# Patient Record
Sex: Male | Born: 1995 | Race: White | Hispanic: No | Marital: Single | Smoking: Never smoker
Health system: Southern US, Community
[De-identification: ages and names within clinical notes are randomized; demographics above are authoritative.]

---

## 2015-10-03 ENCOUNTER — Ambulatory Visit (INDEPENDENT_AMBULATORY_CARE_PROVIDER_SITE_OTHER): Payer: BLUE CROSS/BLUE SHIELD | Admitting: Podiatry

## 2015-10-03 ENCOUNTER — Ambulatory Visit (INDEPENDENT_AMBULATORY_CARE_PROVIDER_SITE_OTHER): Payer: BLUE CROSS/BLUE SHIELD

## 2015-10-03 ENCOUNTER — Encounter: Payer: Self-pay | Admitting: Podiatry

## 2015-10-03 VITALS — BP 136/93 | HR 74 | Resp 16

## 2015-10-03 DIAGNOSIS — M79673 Pain in unspecified foot: Secondary | ICD-10-CM

## 2015-10-03 DIAGNOSIS — Q6689 Other  specified congenital deformities of feet: Secondary | ICD-10-CM

## 2015-10-03 DIAGNOSIS — M779 Enthesopathy, unspecified: Secondary | ICD-10-CM

## 2015-10-03 MED ORDER — TRIAMCINOLONE ACETONIDE 10 MG/ML IJ SUSP
10.0000 mg | Freq: Once | INTRAMUSCULAR | Status: AC
  Administered 2015-10-03: 10 mg

## 2015-10-03 NOTE — Progress Notes (Signed)
   Subjective:    Patient ID: Seth Watson, male    DOB: August 20, 1995, 19 y.o.   MRN: [id number]  HPI Patient presents with foot pain in their left foot; dorsal - near ankle joint. Pt stated, "Went to see Dr. Barrington EllisonStrong in [place] two weeks ago and was told had a missing bone, then had cartilidge issues; MRI was done"  Pt wants a second opinion; Pt has had foot pain in left foot since age 20.   Review of Systems  All other systems reviewed and are negative.      Objective:   Physical Exam        Assessment & Plan:

## 2015-10-03 NOTE — Progress Notes (Signed)
Subjective:     Patient ID: Seth Watson, male   DOB: 30-Jul-1995, 20 y.o.   MRN: [id number]  HPI patient presents stating I'm having a lot of pain in my ankle joint left and it's been present for about 5 years and I had an injection last year which improved her for a period of time and 1 early this year which was not successful. They talk about a bone that I might be missing and talk about fusion of bones   Review of Systems  All other systems reviewed and are negative.      Objective:   Physical Exam  Constitutional: He is oriented to person, place, and time.  Cardiovascular: Intact distal pulses.   Musculoskeletal: Normal range of motion.  Neurological: He is oriented to person, place, and time.  Skin: Skin is warm.  Nursing note and vitals reviewed.  neurovascular status found to be intact with muscle strength adequate and patient having normal range of motion right subtalar joint and ankle joint and restricted motion of the subtalar joint left with mild inflammation around the subtalar joint and into the sinus tarsi left. 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 And right. Today I went ahead and I injected the left sinus tarsi 3 mg Kenalog 5 mg Xylocaine and then injected the lateral ankle gutter 3 mg Kenalog 5 mg Xylocaine and advised on anti-inflammatory therapy. I will review this case with Dr. Ardelle AntonWagoner and we will consider CT scans with the possibility long-term for subtalar joint fusion some form of bracing system or possibly resection of coalition. Reviewed x-ray length with his family the causes of pain and probable  Indicated significant changes occurring within the subtalar joint left with no  indication of calcaneal navicular bar

## 2015-10-09 ENCOUNTER — Telehealth: Payer: Self-pay | Admitting: *Deleted

## 2015-10-09 DIAGNOSIS — Q6689 Other  specified congenital deformities of feet: Secondary | ICD-10-CM

## 2015-10-09 DIAGNOSIS — M79673 Pain in unspecified foot: Secondary | ICD-10-CM

## 2015-10-09 DIAGNOSIS — M779 Enthesopathy, unspecified: Secondary | ICD-10-CM

## 2015-10-09 NOTE — Telephone Encounter (Addendum)
 Imaging states pt request to have CT closer to [place].  10/10/2015-Orders faxed to [facility] [phone]. 10/24/2015-Pt's ftr, Dan request pt's CT results.  10/25/2015-Dr. Regal reviewed 10/17/2015 CT results and requested copy of CT Scan disc to be sent for an overread.  Informed Dan of Dr. Beverlee Nimsegal's request to send disc for more indepth reading, and we would call with results and instructions.  Faxed [facility] for CT Scan disc copy [facility].  11/08/2015-Pt's ftr, Jesusita Okaan called.  I called [facility] for report of overread of CT disc.  [facility] receptionist states they do not have this pt's CT in their system.  I spoke with Dr. Charlsie Merlesegal and he stated he would like to have the CT overread, because pt's symptoms are not comparable with the CT and X-ray findings.  I explained to Jesusita OkaDan that the CT disc was sent to [facility] on 10/29/2015, but [facility] could not find and that Dr. Charlsie Merlesegal DID want the overread and I would order stat and send stat.

## 2015-11-08 ENCOUNTER — Telehealth: Payer: Self-pay | Admitting: *Deleted

## 2015-11-08 NOTE — Telephone Encounter (Signed)
Entered in error

## 2015-11-13 ENCOUNTER — Telehealth: Payer: Self-pay | Admitting: *Deleted

## 2015-11-13 NOTE — Telephone Encounter (Addendum)
[facility] - Magda Paganiniwanda states the copy of the CT disc was mailed 11/11/2015.  11/14/2015-Mailed 2nd CT disc copy to [facility]. 11/19/2015-[facility] results received. Schedulers to contact pt to schedule appt to discuss results.  Pt called states he is continuing to have pain.  Pt is scheduled to see Dr. Charlsie Merlesegal on 11/21/2015.  11/19/2015-Dr. Regal came in today and reviewed pt's CT Over read and states he would prefer pt come in when Dr. Ardelle AntonWagoner was here on 11/22/2015.  I left message requesting pt call and change his appt to 11/22/2015, and that Dr. Charlsie Merlesegal had ordered a light pain medication, Tramadol 50mg  #30 one tablet every 8 hours prn foot pain. Called to pt's pharmacy.  I spoke with Martie LeeSabrina - [facility] and requested the CT disc be sent to the [facility] office address as soon as possible.  Martie LeeSabrina states she'll put in the mail tomorrow.  12/04/2015-post op courtesy call-Left message instructing pt to rest, ice and elevate, not to weight bear or dangle the foot more than 15 mins/hour, remain in the boot at all times, leave the dressing in place clean and dry, take the pain medication as directed and call with concerns.

## 2015-11-19 MED ORDER — TRAMADOL HCL 50 MG PO TABS: 50.0000 mg | ORAL_TABLET | Freq: Three times a day (TID) | ORAL | Status: AC | PRN

## 2015-11-21 ENCOUNTER — Ambulatory Visit: Payer: BLUE CROSS/BLUE SHIELD | Admitting: Podiatry

## 2015-11-22 ENCOUNTER — Ambulatory Visit (INDEPENDENT_AMBULATORY_CARE_PROVIDER_SITE_OTHER): Payer: BLUE CROSS/BLUE SHIELD | Admitting: Podiatry

## 2015-11-22 ENCOUNTER — Encounter: Payer: Self-pay | Admitting: Podiatry

## 2015-11-22 VITALS — BP 114/77 | HR 59 | Resp 16

## 2015-11-22 DIAGNOSIS — M79673 Pain in unspecified foot: Secondary | ICD-10-CM

## 2015-11-22 DIAGNOSIS — Q6689 Other  specified congenital deformities of feet: Secondary | ICD-10-CM

## 2015-11-22 NOTE — Patient Instructions (Signed)
Pre-Operative Instructions  Congratulations, you have decided to take an important step to improving your quality of life.  You can be assured that the doctors of [facility] will be with you every step of the way.  1. Plan to be at the surgery center/hospital at least 1 (one) hour prior to your scheduled time unless otherwise directed by the surgical center/hospital staff.  You must have a responsible adult accompany you, remain during the surgery and drive you home.  Make sure you have directions to the surgical center/hospital and know how to get there on time. 2. For hospital based surgery you will need to obtain a history and physical form from your family physician within 1 month prior to the date of surgery- we will give you a form for you primary physician.  3. We make every effort to accommodate the date you request for surgery.  There are however, times where surgery dates or times have to be moved.  We will contact you as soon as possible if a change in schedule is required.   4. No Aspirin/Ibuprofen for one week before surgery.  If you are on aspirin, any non-steroidal anti-inflammatory medications (Mobic, Aleve, Ibuprofen) you should stop taking it 7 days prior to your surgery.  You make take Tylenol  For pain prior to surgery.  5. Medications- If you are taking daily heart and blood pressure medications, seizure, reflux, allergy, asthma, anxiety, pain or diabetes medications, make sure the surgery center/hospital is aware before the day of surgery so they may notify you which medications to take or avoid the day of surgery. 6. No food or drink after midnight the night before surgery unless directed otherwise by surgical center/hospital staff. 7. No alcoholic beverages 24 hours prior to surgery.  No smoking 24 hours prior to or 24 hours after surgery. 8. Wear loose pants or shorts- loose enough to fit over bandages, boots, and casts. 9. No slip on shoes, sneakers are best. 10. Bring  your boot with you to the surgery center/hospital.  Also bring crutches or a walker if your physician has prescribed it for you.  If you do not have this equipment, it will be provided for you after surgery. 11. If you have not been contracted by the surgery center/hospital by the day before your surgery, call to confirm the date and time of your surgery. 12. Leave-time from work may vary depending on the type of surgery you have.  Appropriate arrangements should be made prior to surgery with your employer. 13. Prescriptions will be provided immediately following surgery by your doctor.  Have these filled as soon as possible after surgery and take the medication as directed. 14. Remove nail polish on the operative foot. 15. Wash the night before surgery.  The night before surgery wash the foot and leg well with the antibacterial soap provided and water paying special attention to beneath the toenails and in between the toes.  Rinse thoroughly with water and dry well with a towel.  Perform this wash unless told not to do so by your physician.  Enclosed: 1 Ice pack (please put in freezer the night before surgery)   1 Hibiclens skin cleaner   Pre-op Instructions  If you have any questions regarding the instructions, do not hesitate to call our office.  [place]: [address] [place], [place] [address] [phone]  [place]: [address] [place], [place] [address] [phone]  [place]: [address]  [place], [place] [address] [phone]   Dr.   Bevan Vu DPM, Dr. Matthew Wagoner DPM, Dr. M. Todd Hyatt DPM, Dr. Titorya Stover DPM 

## 2015-11-24 NOTE — Progress Notes (Signed)
Subjective:     Patient ID: Seth Watson, male   DOB: 1995/12/30, 19 y.o.   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 Ardelle AntonWagoner area we reviewed CT scans and x-rays and decide since he has no motion now that this would be the best long-term procedure for him. Allowed him to read consent form reviewing at great length the procedure itself and the fact that recovery will be approximate one year with no long-term guarantees and that he will loose further motion. Patient wants surgery signed consent form is dispensed air fracture walker and is scheduled for outpatient surgery which will be on Dr. Ardelle AntonWagoner schedule and I will assist. Dr. Ardelle AntonWagoner did review the course of treatment with him

## 2015-11-26 ENCOUNTER — Telehealth: Payer: Self-pay | Admitting: *Deleted

## 2015-11-26 NOTE — Telephone Encounter (Signed)
Patient's father, Sherald HessDan Webb, called this morning asking if his son would need any type of lab works or EKG done prior to his surgery which will be on December 03, 2015 at 6 a.m. I asked Delydia, CMA who coordinates the surgeries if the patient would need to get any type of lab work done. She stated, "He does not need any done. [facility] does not require any type of lab work before doing procedures". I informed Mr. Hyman HopesWebb and he stated he understood.

## 2015-11-27 ENCOUNTER — Telehealth: Payer: Self-pay | Admitting: *Deleted

## 2015-11-27 NOTE — Telephone Encounter (Signed)
I'm calling to let you know that Dr. Charlsie Merlesegal was able to rearrange his schedule.  He will be able to do your surgery on Tuesday the 6th after all.  "Great, thank you so much.  Will I need to be there at the same time?"  Yes, everything will be the same.  I called and rescheduled surgery at the surgical center.

## 2015-11-27 NOTE — Telephone Encounter (Signed)
I am calling regarding your surgery.  Dr. Charlsie Merlesegal has a scheduling conflict and we need to reschedule your surgery to Wednesday, June 7 instead of June 6.  Will this be okay with you?  "No, it shouldn't be a problem."  Thank you so much.  Surgery was moved from 12/03/2015 to 12/04/2015 at the surgical center.

## 2015-12-03 ENCOUNTER — Encounter: Payer: Self-pay | Admitting: Podiatry

## 2015-12-03 DIAGNOSIS — S93612S Sprain of tarsal ligament of left foot, sequela: Secondary | ICD-10-CM | POA: Diagnosis not present

## 2015-12-11 ENCOUNTER — Ambulatory Visit (INDEPENDENT_AMBULATORY_CARE_PROVIDER_SITE_OTHER): Payer: BLUE CROSS/BLUE SHIELD | Admitting: Podiatry

## 2015-12-11 ENCOUNTER — Ambulatory Visit (HOSPITAL_BASED_OUTPATIENT_CLINIC_OR_DEPARTMENT_OTHER)
Admission: RE | Admit: 2015-12-11 | Discharge: 2015-12-11 | Disposition: A | Discharge status: Home / Self Care | Payer: BLUE CROSS/BLUE SHIELD | Source: Ambulatory Visit | Attending: Podiatry | Admitting: Podiatry

## 2015-12-11 ENCOUNTER — Encounter: Payer: Self-pay | Admitting: Podiatry

## 2015-12-11 DIAGNOSIS — Z9889 Other specified postprocedural states: Secondary | ICD-10-CM | POA: Insufficient documentation

## 2015-12-11 DIAGNOSIS — Q6689 Other  specified congenital deformities of feet: Secondary | ICD-10-CM | POA: Diagnosis not present

## 2015-12-11 DIAGNOSIS — M79673 Pain in unspecified foot: Secondary | ICD-10-CM

## 2015-12-12 NOTE — Progress Notes (Addendum)
Patient ID: Seth Watson Watson, male   DOB: Jan 22, 1996, 20 y.o.   MRN: [id number]  Subjective: Seth Watson Kirksey is a 20 y.o. is seen today in office s/p left STJ fusion preformed on 12/03/15. He states he is doing well. He has decreased the amount of pain medicines been taking it maybe takes it once a day. He has remained nonweightbearing. Denies any systemic complaints such as fevers, chills, nausea, vomiting. No calf pain, chest pain, shortness of breath.   Objective: General: No acute distress, AAOx3; posterior splint intact DP/PT pulses palpable 2/4, CRT < 3 sec to all digits.  Protective sensation intact. Motor function intact.  Left foot: Incision is well coapted without any evidence of dehiscence and sutures intact. There is no surrounding erythema, ascending cellulitis, fluctuance, crepitus, malodor, drainage/purulence. There is mild edema around the surgical site. There is mild pain along the surgical site. There appears to be a superficial abrasion, erythema on the dorsomedial aspect the left foot likely from the bandage. No flexions or crepitus. No drainage. No open lesion. No other areas of tenderness to bilateral lower extremities.  No other open lesions or pre-ulcerative lesions.  No pain with calf compression, swelling, warmth, erythema.   Assessment and Plan:  Status post left foot STJ fusion, doing well with no complications   -Treatment options discussed including all alternatives, risks, and complications -X-rays ordered and reviewed. -And buttock ointment was applied followed by Adaptic overlying the abrasion as well as the incision. Dressing was applied. Well-padded below-knee fiberglass posterior splint was applied making sure to pad all bony prominences. -Continue nonweightbearing with a knee scooter for which she has. -Ice/elevation -Pain medication as needed. -Monitor for any clinical signs or symptoms of infection and DVT/PE and directed to call the office immediately should any  occur or go to the ER. -Follow-up in 1 week or sooner if any problems arise. In the meantime, encouraged to call the office with any questions, concerns, change in symptoms.   Ovid CurdMatthew Davon Abdelaziz, DPM

## 2015-12-13 ENCOUNTER — Encounter: Payer: Self-pay | Admitting: Podiatry

## 2015-12-18 ENCOUNTER — Encounter: Payer: Self-pay | Admitting: Podiatry

## 2015-12-20 ENCOUNTER — Encounter: Payer: Self-pay | Admitting: Podiatry

## 2015-12-20 ENCOUNTER — Ambulatory Visit (INDEPENDENT_AMBULATORY_CARE_PROVIDER_SITE_OTHER): Payer: BLUE CROSS/BLUE SHIELD | Admitting: Podiatry

## 2015-12-20 DIAGNOSIS — Q6689 Other  specified congenital deformities of feet: Secondary | ICD-10-CM

## 2015-12-20 DIAGNOSIS — Z9889 Other specified postprocedural states: Secondary | ICD-10-CM

## 2015-12-20 MED ORDER — OXYCODONE-ACETAMINOPHEN 5-325 MG PO TABS: 1.0000 | ORAL_TABLET | Freq: Four times a day (QID) | ORAL | Status: DC | PRN

## 2015-12-20 MED ORDER — CYCLOBENZAPRINE HCL 10 MG PO TABS: 10.0000 mg | ORAL_TABLET | Freq: Three times a day (TID) | ORAL | Status: AC | PRN

## 2015-12-22 NOTE — Progress Notes (Signed)
Patient ID: Seth AlarBryant Austria, male   DOB: 1996/03/04, 20 y.o.   MRN: [id number]  Subjective: Seth Watson is a 20 y.o. is seen today in office s/p left STJ fusion preformed on 11/02/15. He states he is doing well and his pain has decreased compared to last week's visit. He is remaining nonweightbearing and he denies any recent injury or falls. Denies any systemic complaints such as fevers, chills, nausea, vomiting. No calf pain, chest pain, shortness of breath.   Objective: General: No acute distress, AAOx3; posterior splint intact DP/PT pulses palpable 2/4, CRT < 3 sec to all digits.  Protective sensation intact. Motor function intact.  Left foot: Incision is well coapted without any evidence of dehiscence and staples and sutures intact. There is no surrounding erythema, ascending cellulitis, fluctuance, crepitus, malodor, drainage/purulence. There is mild edema around the surgical site but improved. There is decreased pain along the surgical site. The abrasion lesion on the dorsal aspect of the foot has significantly improved. No fluctuation or crepitus. No drainage. No open lesion. No other areas of tenderness to bilateral lower extremities.  No other open lesions or pre-ulcerative lesions.  No pain with calf compression, swelling, warmth, erythema.   Assessment and Plan:  Status post left foot STJ fusion, doing well with no complications   -Treatment options discussed including all alternatives, risks, and complications -Previous x-rays were reviewed with the patient. -Staples and sutures removed today in total. Antibiotic ointment was applied followed by dressing. Next a well-padded below-knee fiberglass cast was applied making sure to pad all bony prominences. -Continue pain medication as needed. Pain medication was refilled today. -Nonweightbearing -Ice and elevation -Aspirin daily -Follow-up in 3 weeks or sooner if needed. Encouraged to call any questions or concerns in the meantime.  Ovid CurdMatthew  Wagoner, DPM

## 2015-12-27 ENCOUNTER — Ambulatory Visit: Payer: BLUE CROSS/BLUE SHIELD | Admitting: Podiatry

## 2015-12-30 ENCOUNTER — Encounter: Payer: Self-pay | Admitting: Podiatry

## 2016-01-08 ENCOUNTER — Ambulatory Visit (INDEPENDENT_AMBULATORY_CARE_PROVIDER_SITE_OTHER): Payer: BLUE CROSS/BLUE SHIELD | Admitting: Podiatry

## 2016-01-08 ENCOUNTER — Ambulatory Visit (HOSPITAL_BASED_OUTPATIENT_CLINIC_OR_DEPARTMENT_OTHER)
Admission: RE | Admit: 2016-01-08 | Discharge: 2016-01-08 | Disposition: A | Discharge status: Home / Self Care | Payer: BLUE CROSS/BLUE SHIELD | Source: Ambulatory Visit | Attending: Podiatry | Admitting: Podiatry

## 2016-01-08 ENCOUNTER — Other Ambulatory Visit: Payer: Self-pay | Admitting: Podiatry

## 2016-01-08 DIAGNOSIS — Z9889 Other specified postprocedural states: Secondary | ICD-10-CM | POA: Insufficient documentation

## 2016-01-08 DIAGNOSIS — Q6689 Other  specified congenital deformities of feet: Secondary | ICD-10-CM | POA: Insufficient documentation

## 2016-01-08 MED ORDER — GABAPENTIN 100 MG PO CAPS: 100.0000 mg | ORAL_CAPSULE | Freq: Every day | ORAL | Status: AC

## 2016-01-08 NOTE — Patient Instructions (Signed)
Gabapentin capsules or tablets What is this medicine? GABAPENTIN (GA ba pen tin) is used to control partial seizures in adults with epilepsy. It is also used to treat certain types of nerve pain. This medicine may be used for other purposes; ask your health care provider or pharmacist if you have questions. What should I tell my health care provider before I take this medicine? They need to know if you have any of these conditions: -kidney disease -suicidal thoughts, plans, or attempt; a previous suicide attempt by you or a family member -an unusual or allergic reaction to gabapentin, other medicines, foods, dyes, or preservatives -pregnant or trying to get pregnant -breast-feeding How should I use this medicine? Take this medicine by mouth with a glass of water. Follow the directions on the prescription label. You can take it with or without food. If it upsets your stomach, take it with food.Take your medicine at regular intervals. Do not take it more often than directed. Do not stop taking except on your doctor's advice. If you are directed to break the 600 or 800 mg tablets in half as part of your dose, the extra half tablet should be used for the next dose. If you have not used the extra half tablet within 28 days, it should be thrown away. A special MedGuide will be given to you by the pharmacist with each prescription and refill. Be sure to read this information carefully each time. Talk to your pediatrician regarding the use of this medicine in children. Special care may be needed. Overdosage: If you think you have taken too much of this medicine contact a poison control center or emergency room at once. NOTE: This medicine is only for you. Do not share this medicine with others. What if I miss a dose? If you miss a dose, take it as soon as you can. If it is almost time for your next dose, take only that dose. Do not take double or extra doses. What may interact with this medicine? Do not  take this medicine with any of the following medications: -other gabapentin products This medicine may also interact with the following medications: -alcohol -antacids -antihistamines for allergy, cough and cold -certain medicines for anxiety or sleep -certain medicines for depression or psychotic disturbances -homatropine; hydrocodone -naproxen -narcotic medicines (opiates) for pain -phenothiazines like chlorpromazine, mesoridazine, prochlorperazine, thioridazine This list may not describe all possible interactions. Give your health care provider a list of all the medicines, herbs, non-prescription drugs, or dietary supplements you use. Also tell them if you smoke, drink alcohol, or use illegal drugs. Some items may interact with your medicine. What should I watch for while using this medicine? Visit your doctor or health care professional for regular checks on your progress. You may want to keep a record at home of how you feel your condition is responding to treatment. You may want to share this information with your doctor or health care professional at each visit. You should contact your doctor or health care professional if your seizures get worse or if you have any new types of seizures. Do not stop taking this medicine or any of your seizure medicines unless instructed by your doctor or health care professional. Stopping your medicine suddenly can increase your seizures or their severity. Wear a medical identification bracelet or chain if you are taking this medicine for seizures, and carry a card that lists all your medications. You may get drowsy, dizzy, or have blurred vision. Do not drive, use   machinery, or do anything that needs mental alertness until you know how this medicine affects you. To reduce dizzy or fainting spells, do not sit or stand up quickly, especially if you are an older patient. Alcohol can increase drowsiness and dizziness. Avoid alcoholic drinks. Your mouth may get  dry. Chewing sugarless gum or sucking hard candy, and drinking plenty of water will help. The use of this medicine may increase the chance of suicidal thoughts or actions. Pay special attention to how you are responding while on this medicine. Any worsening of mood, or thoughts of suicide or dying should be reported to your health care professional right away. Women who become pregnant while using this medicine may enroll in the [place] American Antiepileptic Drug Pregnancy Registry by calling 1-888-233-2334. This registry collects information about the safety of antiepileptic drug use during pregnancy. What side effects may I notice from receiving this medicine? Side effects that you should report to your doctor or health care professional as soon as possible: -allergic reactions like skin rash, itching or hives, swelling of the face, lips, or tongue -worsening of mood, thoughts or actions of suicide or dying Side effects that usually do not require medical attention (report to your doctor or health care professional if they continue or are bothersome): -constipation -difficulty walking or controlling muscle movements -dizziness -nausea -slurred speech -tiredness -tremors -weight gain This list may not describe all possible side effects. Call your doctor for medical advice about side effects. You may report side effects to FDA at 1-800-FDA-1088. Where should I keep my medicine? Keep out of reach of children. This medicine may cause accidental overdose and death if it taken by other adults, children, or pets. Mix any unused medicine with a substance like cat litter or coffee grounds. Then throw the medicine away in a sealed container like a sealed bag or a coffee can with a lid. Do not use the medicine after the expiration date. Store at room temperature between 15 and 30 degrees C (59 and 86 degrees F). NOTE: This sheet is a summary. It may not cover all possible information. If you have  questions about this medicine, talk to your doctor, pharmacist, or health care provider.    2016, Elsevier/Gold Standard. (2013-08-11 15:26:50)  

## 2016-01-10 ENCOUNTER — Telehealth: Payer: Self-pay | Admitting: *Deleted

## 2016-01-10 DIAGNOSIS — Q6689 Other  specified congenital deformities of feet: Secondary | ICD-10-CM

## 2016-01-10 DIAGNOSIS — Z9889 Other specified postprocedural states: Secondary | ICD-10-CM

## 2016-01-10 NOTE — Telephone Encounter (Addendum)
Pt states had foot recasted 01/08/2016, and began to have discomfort on the top of the foot in the cast on Wednesday evening.  Dr. Ardelle AntonWagoner states stay off the foot, rest, ice and elevate, if worsens go to ER, if continues come in Monday for cast change.  I informed pt and he said he would do that, he had been driving a lot and that may have affected the area. 01/23/2016-Pt's dad, Jesusita OkaDan states pt forgot to get x-rays yesterday after the visit, and he was wondering if they were actually necessary, if so could they be taken in [place] on Monday. Dr. Ardelle AntonWagoner stated he will get the x-rays next time not to worry about 01/22/2016 x-rays. Left message informing pt's ftr of Dr. Gabriel RungWagoner's recommendation. 02/20/2016-Stephanie - [facility] [phone] request PT orders, LOV and Op note faxed to [phone]. 04/17/2016-Dr. Ardelle AntonWagoner states PT recommended custom made orthotics, he would like to see if pt has, and have him make an appt when in town. Left message stating Dr. Ardelle AntonWagoner would like pt to make an appt when in town, to please contact me at [phone]/29/2016-Trey - [facility] picked up clinicals, demographics for pt.11/13/2016-Faxed CT orders with note stating PA was not required, and that the CT would need to be scheduled 2 weeks prior 12/23/2016.

## 2016-01-12 NOTE — Progress Notes (Signed)
Patient ID: Seth Watson, male   DOB: [birth year], 20 y.o.   MRN: [id number]  Subjective: Seth AlarBryant Maslow is a 20 y.o. is seen today in office s/p left STJ fusion preformed on 11/02/15. He states he is doing well and his pain has decreased compared to last week's visit. He does state that he is having some "nervous tension" to his feet. The Flexeril did not help. He is having difficulty sleeping due to some of the tingling to his foot. Than that he is having no acute changes. He states his pain is minimal not taking pain medicine. Denies any systemic complaints such as fevers, chills, nausea, vomiting. No calf pain, chest pain, shortness of breath.   Objective: General: No acute distress, AAOx3; posterior splint intact DP/PT pulses palpable 2/4, CRT < 3 sec to all digits.  Protective sensation intact. Motor function intact.  Left foot: Cast was removed in order to inspect the incision and to see how he is doing. Incision is well coapted without any evidence of dehiscence and scars have formed. There is no surrounding erythema, ascending cellulitis, fluctuance, crepitus, malodor, drainage/purulence. There is mild edema around the surgical site without any associated erythema. There is decreased pain along the surgical site. The abrasion lesion on the dorsal aspect of the foot has significantly improved and new skin has formed.. No fluctuation or crepitus. No drainage. No open lesion. No other areas of tenderness to bilateral lower extremities.  No other open lesions or pre-ulcerative lesions.  No pain with calf compression, swelling, warmth, erythema.   Assessment and Plan:  Status post left foot STJ fusion, doing well with no complications   -Treatment options discussed including all alternatives, risks, and complications -X-rays ordered and reviewed with the patient. -Antibiotic ointment was applied over the incisions followed by dry sterile dressing. A well-padded below-knee fiberglass cast was applied  making sure to pad all bony prominences. -Continue nonweightbearing -Prescribed gabapentin to see if this will help with some of the nerve symptoms that he is having. Discontinue Flexeril. I discussed with him side effects this medicine directed to call the office should any occur. -Ice and elevation -Aspirin daily. -Monitor signs or symptoms of infection and/or DVT/PE injected to call the office immediately should any occur. -Follow-up as scheduled or sooner if any issues are to arise.  Ovid CurdMatthew Wagoner, DPM

## 2016-01-22 ENCOUNTER — Encounter: Payer: Self-pay | Admitting: Podiatry

## 2016-01-22 ENCOUNTER — Ambulatory Visit (INDEPENDENT_AMBULATORY_CARE_PROVIDER_SITE_OTHER): Payer: BLUE CROSS/BLUE SHIELD | Admitting: Podiatry

## 2016-01-22 DIAGNOSIS — Z09 Encounter for follow-up examination after completed treatment for conditions other than malignant neoplasm: Secondary | ICD-10-CM

## 2016-01-23 NOTE — Telephone Encounter (Signed)
Its ok. Ill get them next time. Continue NWB

## 2016-01-27 NOTE — Progress Notes (Signed)
Patient ID: Seth Watson, male   DOB: 03/14/1996, 20 y.o.   MRN: [id number]  Subjective: Tharun Puck is a 20 y.o. is seen today in office s/p left STJ fusion preformed on 11/02/15. He states that overall he is doing well the nerve pain that he was having has been improved with the gabapentin. He states that he is not really having any significant pain to his foot. He has been trying to stay nonweightbearing status possible.  Denies any systemic complaints such as fevers, chills, nausea, vomiting. No calf pain, chest pain, shortness of breath.   Objective: General: No acute distress, AAOx3; posterior splint intact DP/PT pulses palpable 2/4, CRT < 3 sec to all digits.  Protective sensation intact. Motor function intact.  Left foot: Cast is clean, dry, intact. Upon removal incision appears be well coapted without any evidence dehiscence. There is no erythema, ascending saline cellulitis fluctuance, crepitus, malodor. Mild edema to the surgical site. There is no significant tenderness on the surgical site at this time. No other areas of tenderness to bilateral lower extremities.  No other open lesions or pre-ulcerative lesions.  No pain with calf compression, swelling, warmth, erythema.   Assessment and Plan:  Status post left foot STJ fusion, doing well with no complications   -Treatment options discussed including all alternatives, risks, and complications -X-rays ordered today. -Cast was removed. He was placed into a cam boot but remain nonweightbearing. Start range of motion, rehabilitation exercises against gravity. Continue ice and elevation. -Pain medication as needed. Gabapentin as needed. -Aspirin daily. -Monitor signs or symptoms of infection and/or DVT/PE injected to call the office immediately should any occur. -Follow-up as scheduled or sooner if any issues are to arise.  [name], DPM

## 2016-02-05 ENCOUNTER — Encounter: Payer: Self-pay | Admitting: Podiatry

## 2016-02-05 ENCOUNTER — Ambulatory Visit (INDEPENDENT_AMBULATORY_CARE_PROVIDER_SITE_OTHER): Payer: BLUE CROSS/BLUE SHIELD | Admitting: Podiatry

## 2016-02-05 ENCOUNTER — Ambulatory Visit (HOSPITAL_BASED_OUTPATIENT_CLINIC_OR_DEPARTMENT_OTHER)
Admission: RE | Admit: 2016-02-05 | Discharge: 2016-02-05 | Disposition: A | Discharge status: Home / Self Care | Payer: BLUE CROSS/BLUE SHIELD | Source: Ambulatory Visit | Attending: Podiatry | Admitting: Podiatry

## 2016-02-05 DIAGNOSIS — Z09 Encounter for follow-up examination after completed treatment for conditions other than malignant neoplasm: Secondary | ICD-10-CM

## 2016-02-05 DIAGNOSIS — Q6689 Other  specified congenital deformities of feet: Secondary | ICD-10-CM

## 2016-02-06 NOTE — Progress Notes (Signed)
Patient ID: Seth AlarBryant Watson, male   DOB: 01/20/1996, 20 y.o.   MRN: [id number]  Subjective: Seth Watson is a 20 y.o. is seen today in office s/p left STJ fusion preformed on 11/02/15. He states he has no pain to his foot. He medication of discomfort when he keeps his foot down for quite some time and it swells. He states he is no longer having any nerve pain either. He states it is eager to put weight on the foot. Denies any systemic complaints such as fevers, chills, nausea, vomiting. No calf pain, chest pain, shortness of breath.   Objective: General: No acute distress, AAOx3; posterior splint intact DP/PT pulses palpable 2/4, CRT < 3 sec to all digits.  Protective sensation intact. Motor function intact.  Left foot: CAM boot intact. Scars are well-healed without any evidence of dehiscence and there is no surrounding erythema or increase in warmth. No drainage. There is trace edema to the surgical site. There is no tenderness on surgical sites at this time. Overall sensation appears intact. Motor function intact of the digits and ankle. No other areas of tenderness to bilateral lower extremities.  No other open lesions or pre-ulcerative lesions.  No pain with calf compression, swelling, warmth, erythema.   Assessment and Plan:  Status post left foot STJ fusion, doing well with no complications   -Treatment options discussed including all alternatives, risks, and complications -X-rays ordered today. -At this time he can start to transition to partial weightbearing as tolerated in the cam boot. Continue work and boot at all times. Start range of motion exercises to help increase strength. He is going to college in 2 weeks I'll see him before he goes. I will order physical therapy closer to where he is going to college. -Pain medication as needed. Gabapentin as needed. -Aspirin daily. -Monitor signs or symptoms of infection and/or DVT/PE injected to call the office immediately should any  occur. -Follow-up as scheduled or sooner if any issues are to arise.  Ovid CurdMatthew Xitlali Kastens, DPM

## 2016-02-17 ENCOUNTER — Ambulatory Visit (INDEPENDENT_AMBULATORY_CARE_PROVIDER_SITE_OTHER): Payer: BLUE CROSS/BLUE SHIELD

## 2016-02-17 ENCOUNTER — Encounter: Payer: Self-pay | Admitting: Podiatry

## 2016-02-17 ENCOUNTER — Ambulatory Visit (INDEPENDENT_AMBULATORY_CARE_PROVIDER_SITE_OTHER): Payer: BLUE CROSS/BLUE SHIELD | Admitting: Podiatry

## 2016-02-17 DIAGNOSIS — M79671 Pain in right foot: Secondary | ICD-10-CM

## 2016-02-17 DIAGNOSIS — Z9889 Other specified postprocedural states: Secondary | ICD-10-CM

## 2016-02-17 DIAGNOSIS — Q6689 Other  specified congenital deformities of feet: Secondary | ICD-10-CM

## 2016-02-17 NOTE — Progress Notes (Signed)
Patient ID: Seth Watson, male   DOB: [name], 20 y.o.   MRN: [id number]  Subjective: Seth Watson is a 20 y.o. is seen today in office s/p left STJ fusion preformed on 11/02/15. His last appointment he states he has done well he is started to put some weight down on the foot utilizing crutches. [place] and walking about 5 miles in the boot using crutches to help offload the area. He is not able to put full weight on without the use of crutches. He is scheduled to move into his dorm this week.  Denies any systemic complaints such as fevers, chills, nausea, vomiting. No calf pain, chest pain, shortness of breath.   Objective: General: No acute distress, AAOx3; posterior splint intact DP/PT pulses palpable 2/4, CRT < 3 sec to all digits.  Protective sensation intact. Motor function intact.  Left foot: CAM boot intact. Scar from the surgery is well healed. There is no tenderness palpation of the surgical site at this time. No pain with ankle joint range of motion. There is trace edema along the area any erythema or increase in warmth. On the anterior aspect of ankles a superficial abrasion from the bandage however this has improved. There is no surrounding erythema or signs of infection. No other areas of tenderness to bilateral lower extremities. No other open lesions or pre-ulcerative lesions.  No pain with calf compression, swelling, warmth, erythema.   Assessment and Plan:  Status post left foot STJ fusion, doing well with no complications   -Treatment options discussed including all alternatives, risks, and complications -X-rays obtained and reviewed today. Hardware, subtalar joint is increased consolidation across the arthrodesis site. No evidence of acute fracture. -At this time to start transition to weight-bear as tolerated in cam boot. Once is able to do this he can start to transition to regular shoe. I will go ahead and start physical therapy. Physical therapy  can work with him on transition to a regular shoe from the CAM boot and improving strength and mobility to his left foot. -Continue ice and elevation. -Once he returns to a regular shoe, discussed supportive shoes -As he is going to college this week I will see him and he comes back for follow-up break. If at any point he has any questions he is encouraged to call the office and get him into see somebody closer to his school.   Seth Watson, DPM

## 2016-02-20 ENCOUNTER — Telehealth: Payer: Self-pay | Admitting: *Deleted

## 2016-02-20 NOTE — Telephone Encounter (Signed)
Entered in error

## 2016-03-05 NOTE — Progress Notes (Signed)
DOS 06.06.2017 Sub taylor joint fusion with screw left

## 2016-04-02 ENCOUNTER — Encounter: Payer: Self-pay | Admitting: Podiatry

## 2016-04-02 ENCOUNTER — Ambulatory Visit: Payer: BLUE CROSS/BLUE SHIELD

## 2016-04-02 ENCOUNTER — Ambulatory Visit (INDEPENDENT_AMBULATORY_CARE_PROVIDER_SITE_OTHER): Payer: BLUE CROSS/BLUE SHIELD | Admitting: Podiatry

## 2016-04-02 DIAGNOSIS — Z09 Encounter for follow-up examination after completed treatment for conditions other than malignant neoplasm: Secondary | ICD-10-CM

## 2016-04-02 DIAGNOSIS — Q6689 Other  specified congenital deformities of feet: Secondary | ICD-10-CM

## 2016-04-04 NOTE — Progress Notes (Signed)
Patient ID: Seth AlarBryant Basaldua, male   DOB: 03/31/1996, 20 y.o.   MRN: [id number]  Subjective: Seth Watson is a 20 y.o. is seen today in office s/p left STJ fusion preformed on 11/02/15. He states he is doing well and he is returned to regular shoe. He has been in college since the start of the semester he's been able to wear regular shoe and walker across [place] into his daily activities without any pain to his foot and he states that feels substantially better than it did prior to surgery. He presents today wearing a regular shoe walking without any problems Denies any systemic complaints such as fevers, chills, nausea, vomiting. No calf pain, chest pain, shortness of breath.   Objective: General: No acute distress, AAOx3; posterior splint intact DP/PT pulses palpable 2/4, CRT < 3 sec to all digits.  Protective sensation intact. Motor function intact.  Left foot: Scar from the surgery is well healed. There is no tenderness palpation of the surgical site at this time. There is no pain to the ankle or to the lateral gutter. There is no restriction or crepitation of the ankle joint range of motion or any pain with ankle joint range of motion. There is no tenderness palpation of the surgical site. There is no significant edema, erythema or increase in warmth. Overall he appears to be healing well. No other open lesions or pre-ulcerative lesions.  No pain with calf compression, swelling, warmth, erythema.   Assessment and Plan:  Status post left foot STJ fusion, doing well with no complications   -Treatment options discussed including all alternatives, risks, and complications -X-rays obtained and reviewed today. Hardware, subtalar joint is increased consolidation across the arthrodesis site. On the AP view of the ankle does appear the screw is right on the verge of entering the lateral gutter of the ankle however clinically he has no symptoms at this. I discussed these x-ray findings with the patient. -At this  time continue supportive shoe and continue increase his activity. He states in the first gets out of bed he gets some stiffness for the first 2-3 steps but this does resolve very quickly he has no further discomfort or stiffness. -I would like to see him back in 2 months as discussed the possible removal of the hardware causing any issues in the meantime. Call any questions or concerns.  Ovid CurdMatthew Eimi Viney, DPM

## 2016-09-28 ENCOUNTER — Ambulatory Visit (INDEPENDENT_AMBULATORY_CARE_PROVIDER_SITE_OTHER): Payer: BLUE CROSS/BLUE SHIELD

## 2016-09-28 ENCOUNTER — Ambulatory Visit (INDEPENDENT_AMBULATORY_CARE_PROVIDER_SITE_OTHER): Payer: BLUE CROSS/BLUE SHIELD | Admitting: Podiatry

## 2016-09-28 DIAGNOSIS — S92909K Unspecified fracture of unspecified foot, subsequent encounter for fracture with nonunion: Secondary | ICD-10-CM | POA: Diagnosis not present

## 2016-09-28 DIAGNOSIS — Q6689 Other  specified congenital deformities of feet: Secondary | ICD-10-CM

## 2016-09-28 DIAGNOSIS — S82899K Other fracture of unspecified lower leg, subsequent encounter for closed fracture with nonunion: Secondary | ICD-10-CM

## 2016-09-29 NOTE — Progress Notes (Signed)
Subjective: 21 year old male presents the office they for long-term follow-up status post right subtalar joint fusion. He states that he has been doing well and he has been running and more active ut has started to some mild pain around the outside of the ankle. He states his pain levels 2/10. He denies any recent injury or trauma to the area. He has not had any significant increase in swelling and denies any redness or warmth. He has no numbness or tingling. Denies any systemic complaints such as fevers, chills, nausea, vomiting. No acute changes since last appointment, and no other complaints at this time.   Objective: AAO x3, NAD DP/PT pulses palpable bilaterally, CRT less than 3 seconds Incision is well coapted. There is mild tenderness along the lateral ankle along the lateral gutter. There is no pain with ankle joint ROM. STJ appears stable. There is trace edema to the surgical site. There is no erythema or increase in warmth. He presents today wearing a regular shoe without any issues.  No open lesions or pre-ulcerative lesions.  No pain with calf compression, swelling, warmth, erythema  Assessment: 21 year old male status post right subtalar joint fusion  Plan: -All treatment options discussed with the patient including all alternatives, risks, complications.  -X-rays were obtained and reviewed. 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 Hardware does appear to be penetrating the lateral ankle gutter which is likely causing his symptoms. -He has minimal pain and was doing well until about a week ago. However discussed with him removal of the screw. He is still in college and will finish this semester and may we will likely do the surgery at that time. However the meantime given the radiolucent line I would like to go ahead and order a bone stimulator to help with healing. Also we will order a CT scan to assess nonunion He will do this  once he returns home from the semester.  -Patient encouraged to call the office with any questions, concerns, change in symptoms.   [name], DPM

## 2016-11-10 ENCOUNTER — Other Ambulatory Visit: Payer: BLUE CROSS/BLUE SHIELD

## 2016-11-13 ENCOUNTER — Telehealth: Payer: Self-pay | Admitting: *Deleted

## 2016-11-13 ENCOUNTER — Other Ambulatory Visit: Payer: Self-pay | Admitting: Podiatry

## 2016-11-13 DIAGNOSIS — Q6689 Other  specified congenital deformities of feet: Secondary | ICD-10-CM

## 2016-11-13 NOTE — Telephone Encounter (Signed)
"  I'd like to reschedule my surgery that is scheduled for May 30."  Do you have a date in mind?  "I'd like to do it at the end of June and I'd like to schedule a CT as well.  Let's schedule the CT scan two weeks prior to that date."  [facility] will [facility] you to set up a date and time.  "Okay, thank you."

## 2016-12-08 ENCOUNTER — Ambulatory Visit
Admission: RE | Admit: 2016-12-08 | Discharge: 2016-12-08 | Disposition: A | Discharge status: Home / Self Care | Payer: BLUE CROSS/BLUE SHIELD | Source: Ambulatory Visit | Attending: Podiatry | Admitting: Podiatry

## 2016-12-08 ENCOUNTER — Other Ambulatory Visit: Payer: BLUE CROSS/BLUE SHIELD

## 2016-12-08 DIAGNOSIS — Q6689 Other  specified congenital deformities of feet: Secondary | ICD-10-CM

## 2016-12-10 ENCOUNTER — Telehealth: Payer: Self-pay | Admitting: Podiatry

## 2016-12-10 NOTE — Telephone Encounter (Signed)
Called patient and left voicemail to call back and schedule an appointment to go over his CT results as well as his surgery consult to sign consent forms. Asked pt to call us back so we can get him scheduled before 25 June since his surgery is scheduled for Wednesday 27 June.

## 2016-12-18 ENCOUNTER — Encounter: Payer: Self-pay | Admitting: Podiatry

## 2016-12-18 ENCOUNTER — Ambulatory Visit (INDEPENDENT_AMBULATORY_CARE_PROVIDER_SITE_OTHER): Payer: BLUE CROSS/BLUE SHIELD

## 2016-12-18 ENCOUNTER — Ambulatory Visit (INDEPENDENT_AMBULATORY_CARE_PROVIDER_SITE_OTHER): Payer: BLUE CROSS/BLUE SHIELD | Admitting: Podiatry

## 2016-12-18 DIAGNOSIS — Q6689 Other  specified congenital deformities of feet: Secondary | ICD-10-CM | POA: Diagnosis not present

## 2016-12-18 DIAGNOSIS — Z9889 Other specified postprocedural states: Secondary | ICD-10-CM

## 2016-12-18 NOTE — Patient Instructions (Signed)

## 2016-12-18 NOTE — Progress Notes (Signed)
Subjective: Mr. Shelva MajesticWest presents to the office today for surgical consultation to remove a screw from the LEFT foot. He states that he has been doing well. He states that the screw has been bothering him and he would like to go ahead and have it removed. He denies any recent injury to the lower extremities. No increase in swelling or redness. He states he is been playing tennis and been active and he does well although he states that he can feel one area of pain he points to the anterior lateral aspect of the gutter of the ankle when he gets discomfort. Denies any systemic complaints such as fevers, chills, nausea, vomiting. No acute changes since last appointment, and no other complaints at this time.   Objective: AAO x3, NAD DP/PT pulses palpable bilaterally, CRT less than 3 seconds Incision from the prior surgery is well healed. There is mild discomfort along the lateral ankle gutter. There is no pain with ankle joint ROM. STJ appears fused.  There is no overlying edema, erythema, increase in warmth. There is no other areas of tenderness identified. No open lesions or pre-ulcerative lesions.  No pain with calf compression, swelling, warmth, erythema  Assessment: Retained hardware with pain  Plan: -All treatment options discussed with the patient including all alternatives, risks, complications.  -X-rays were obtained and reviewed today which shows reveal hardware intact. Partial consolidation subtalar joint. I discussed the CT findings with the patient. This time discussed conservative and surgical treatment. I spent to his surgical intervention remove the hardware. Discussed with the surgery as well as risks and postoperative course. He wishes to proceed. -The incision placement as well as the postoperative course was discussed with the patient. I discussed risks of the surgery which include, but not limited to, infection, bleeding, pain, swelling, need for further surgery, delayed or nonhealing,  painful or ugly scar, numbness or sensation changes, over/under correction, recurrence, transfer lesions, further deformity, hardware failure, DVT/PE, loss of toe/foot. Patient understands these risks and wishes to proceed with surgery. The surgical consent was reviewed with the patient all 3 pages were signed. No promises or guarantees were given to the outcome of the procedure. All questions were answered to the best of my ability. Before the surgery the patient was encouraged to call the office if there is any further questions. The [facility] an outpatient basis. -Patient encouraged to call the office with any questions, concerns, change in symptoms.   Ovid CurdMatthew Wagoner, DPM

## 2016-12-23 ENCOUNTER — Telehealth: Payer: Self-pay | Admitting: *Deleted

## 2016-12-23 ENCOUNTER — Encounter: Payer: Self-pay | Admitting: Podiatry

## 2016-12-23 ENCOUNTER — Telehealth: Payer: Self-pay | Admitting: Podiatry

## 2016-12-23 DIAGNOSIS — Z4889 Encounter for other specified surgical aftercare: Secondary | ICD-10-CM | POA: Diagnosis not present

## 2016-12-23 NOTE — Telephone Encounter (Signed)
Seth ChessmanJohnda - [facility] states Vicodin no longer comes in 5/325, but can be ordered as 5/300. Dr. Bary CastillaWagoner okayed the change to Vicodin 5/300.

## 2016-12-23 NOTE — Telephone Encounter (Signed)
Dr. Ardelle AntonWagoner wrote Rx of Vicodin for 5-325 but it no longer comes in that dosage. Can do 5-300 instead. Please call and confirm this is okay.

## 2016-12-25 NOTE — Progress Notes (Signed)
DOS 06.27.2018 Left foot removal of hardware (screw) possible application of new screw.

## 2016-12-28 ENCOUNTER — Encounter: Payer: Self-pay | Admitting: Podiatry

## 2016-12-28 ENCOUNTER — Ambulatory Visit (INDEPENDENT_AMBULATORY_CARE_PROVIDER_SITE_OTHER): Payer: BLUE CROSS/BLUE SHIELD

## 2016-12-28 ENCOUNTER — Ambulatory Visit (INDEPENDENT_AMBULATORY_CARE_PROVIDER_SITE_OTHER): Payer: BLUE CROSS/BLUE SHIELD | Admitting: Podiatry

## 2016-12-28 VITALS — BP 117/86 | HR 80 | Temp 97.6°F | Resp 16

## 2016-12-28 DIAGNOSIS — Z9889 Other specified postprocedural states: Secondary | ICD-10-CM

## 2016-12-31 NOTE — Progress Notes (Signed)
Subjective:    Patient ID: Seth Watson, male   DOB: 21 y.o.   MRN: [id number]   HPI patient states she's doing very well post screw removal from fusion of the subtalar joint performed by Dr. Ardelle AntonWagoner    ROS      Objective:  Physical Exam neurovascular status intact negative Homans sign noted with patient wearing cam walker and utilizing crutches currently with wound edges well coapted and minimal discomfort upon palpation     Assessment:  Doing well post removal of screw from the calcaneus talus       Plan:    H&P x-ray reviewed and advised on continued immobilization gradual increase in activity levels and will reappoint for suture removal in the next several weeks. He will be seen back earlier if any issues should occur  X-rays indicate satisfactory removal of screw from the calcaneus talus with what appears to be well-perfused subtalar joint

## 2017-01-04 ENCOUNTER — Encounter: Payer: Self-pay | Admitting: Podiatry

## 2017-01-04 ENCOUNTER — Ambulatory Visit (INDEPENDENT_AMBULATORY_CARE_PROVIDER_SITE_OTHER): Payer: Self-pay | Admitting: Podiatry

## 2017-01-04 DIAGNOSIS — Z9889 Other specified postprocedural states: Secondary | ICD-10-CM

## 2017-01-08 NOTE — Progress Notes (Signed)
Subjective: Seth Watson is a 21 y.o. is seen today in office s/p right HWR preformed on 12/23/2016. They state their pain is controlled and not taking any pain medication. He's been wearing the surgical shoe not having any issues. He states that he is having prior to surgery has resolved. Denies any systemic complaints such as fevers, chills, nausea, vomiting. No calf pain, chest pain, shortness of breath.   Objective: General: No acute distress, AAOx3  DP/PT pulses palpable 2/4, CRT < 3 sec to all digits.  Protective sensation intact. Motor function intact.  Right foot: Incision is well coapted without any evidence of dehiscence and sutures are intact.. There is no surrounding erythema, ascending cellulitis, fluctuance, crepitus, malodor, drainage/purulence. There is minimal edema around the surgical site. There is no pain along the surgical site.  No other areas of tenderness to bilateral lower extremities.  No other open lesions or pre-ulcerative lesions.  No pain with calf compression, swelling, warmth, erythema.   Assessment and Plan:  Status post right foot hardware removal., doing well with no complications   -Treatment options discussed including all alternatives, risks, and complications -He remained in the surgical shoe. I did keep the sutures intact today and will remove these next week. Continue weightbearing as tolerated. -Ice/elevation -Pain medication as needed. -Monitor for any clinical signs or symptoms of infection and DVT/PE and directed to call the office immediately should any occur or go to the ER. -Follow-up in 1 week for suture removal or sooner if any problems arise. In the meantime, encouraged to call the office with any questions, concerns, change in symptoms.   Seth Watson, DPM

## 2017-01-14 ENCOUNTER — Ambulatory Visit (INDEPENDENT_AMBULATORY_CARE_PROVIDER_SITE_OTHER): Payer: Self-pay | Admitting: Podiatry

## 2017-01-14 DIAGNOSIS — Z09 Encounter for follow-up examination after completed treatment for conditions other than malignant neoplasm: Secondary | ICD-10-CM

## 2017-01-14 DIAGNOSIS — Q6689 Other  specified congenital deformities of feet: Secondary | ICD-10-CM

## 2017-01-14 DIAGNOSIS — Z9889 Other specified postprocedural states: Secondary | ICD-10-CM

## 2017-01-14 NOTE — Progress Notes (Signed)
Subjective: Maryann AlarBryant Bains is a 21 y.o. is seen today in office s/p right HWR preformed on 12/23/2016. He states that he is not having any pain and radicular the stitches taken out. He states that the stitches appointment is only area that he has pain. Denies any redness or any swelling to his foot. The pain in his having prior to surgery has resolved. Its remained in the surgical shoe. Denies any systemic complaints such as fevers, chills, nausea, vomiting. No calf pain, chest pain, shortness of breath.   Objective: General: No acute distress, AAOx3  DP/PT pulses palpable 2/4, CRT < 3 sec to all digits.  Protective sensation intact. Motor function intact.  Right foot: Incision is well coapted without any evidence of dehiscence and sutures are intact. There is no surrounding erythema, ascending cellulitis, fluctuance, crepitus, malodor, drainage/purulence. There is traceedema around the surgical site. There is no pain along the surgical site.  No other areas of tenderness to bilateral lower extremities.  No other open lesions or pre-ulcerative lesions.  No pain with calf compression, swelling, warmth, erythema.   Assessment and Plan:  Status post right foot hardware removal., doing well with no complications   -Treatment options discussed including all alternatives, risks, and complications -Sutures removed today without ankle locations. After removal of the incision remained well coapted. Antibiotic ointment and a Band-Aid is applied. Continues the next week once scar starts to 4 more he can use vitamin E cream/cocoa butter. He inserted transition to regular she was able. Gradually increase activity level. -Follow-up in 4 weeks or sooner if needed.  Ovid CurdMatthew Aldyn Toon, DPM

## 2017-02-11 ENCOUNTER — Ambulatory Visit: Payer: BLUE CROSS/BLUE SHIELD | Admitting: Podiatry

## 2017-11-24 ENCOUNTER — Telehealth: Payer: Self-pay | Admitting: *Deleted

## 2017-12-22 NOTE — Telephone Encounter (Signed)
Entered in error

## 2018-02-13 IMAGING — CT CT FOOT*L* W/O CM
3 of 4 series · 9 of 33 positions shown, 11 images · non-contrast
Comparison: In films of the left foot 12/11/2015.

CLINICAL DATA: Preoperative examination. Patient status post
subtalar fusion. Fusion screws to be removed.

EXAM:
CT OF THE LEFT FOOT WITHOUT CONTRAST
TECHNIQUE: Multidetector CT imaging of the left foot was performed according to
the standard protocol. Multiplanar CT image reconstructions were
also generated.

[Series 9: cor soft tissue · coronal · 0.21mm/px · 3 of 131 slices shown]
[im 27/131  bone]
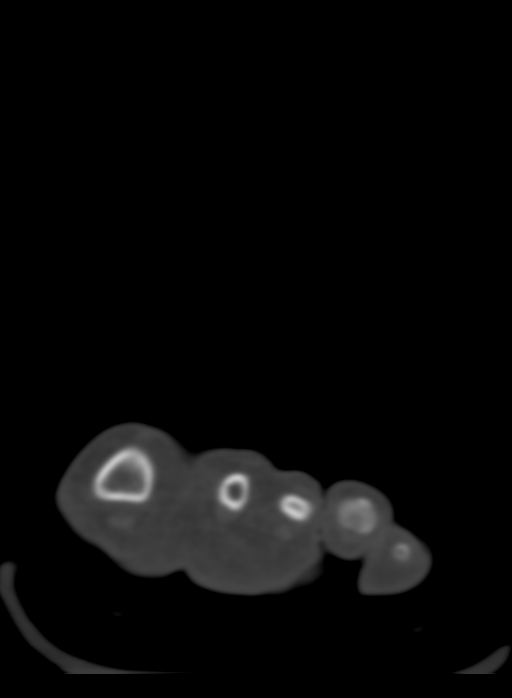
[im 53/131  bone]
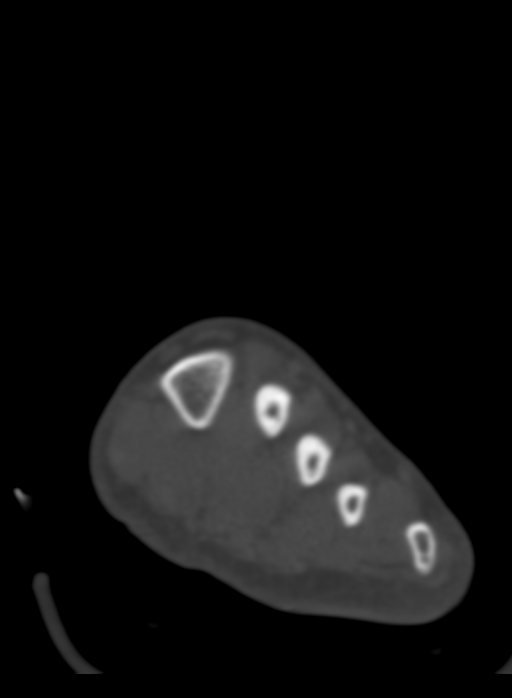
[im 79/131  bone]
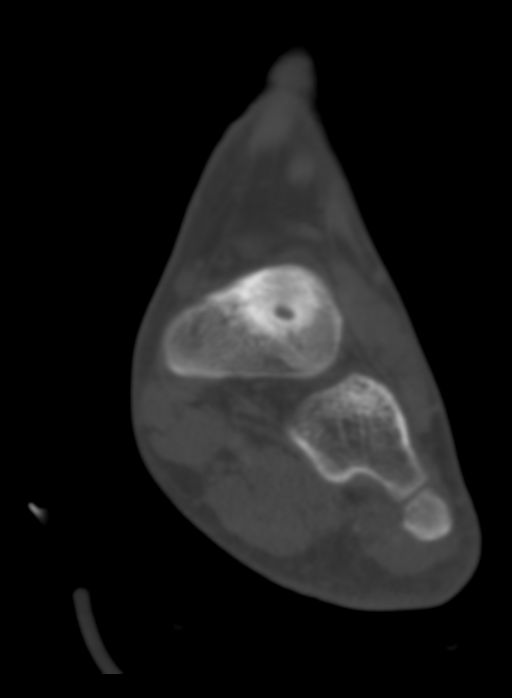

[Series 10: sagsoft tissue · sagittal · 0.25mm/px · 5 of 44 slices shown, 6 images]
[im 15/44  bone]
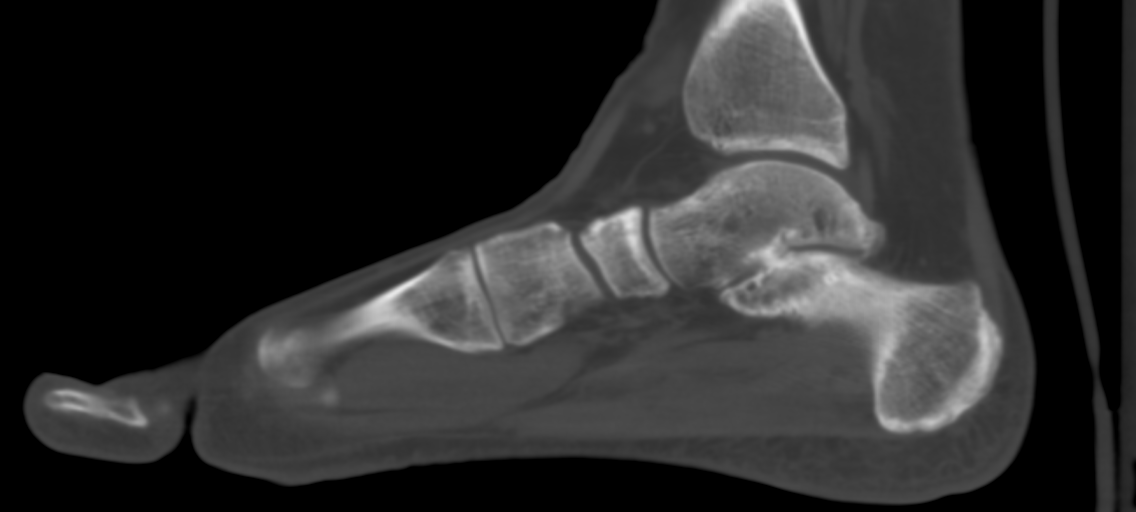
[im 18/44  bone]
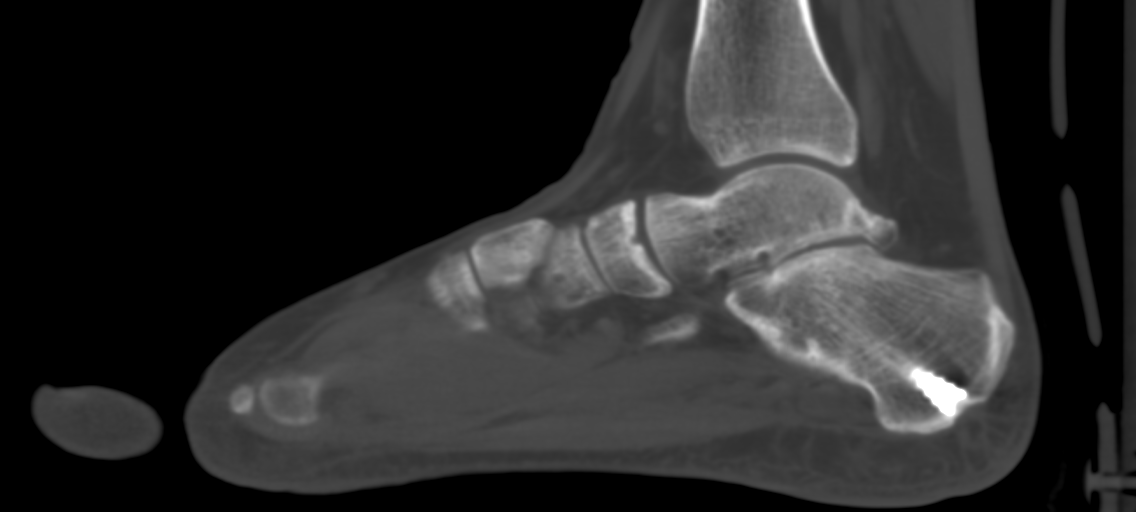
[im 22/44  soft-tissue]
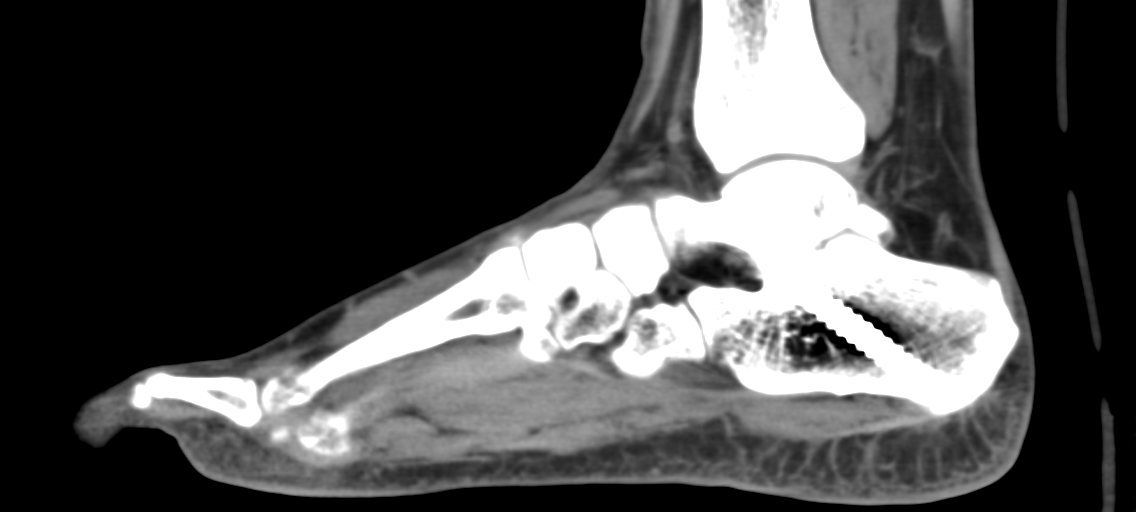
[im 22/44  bone]
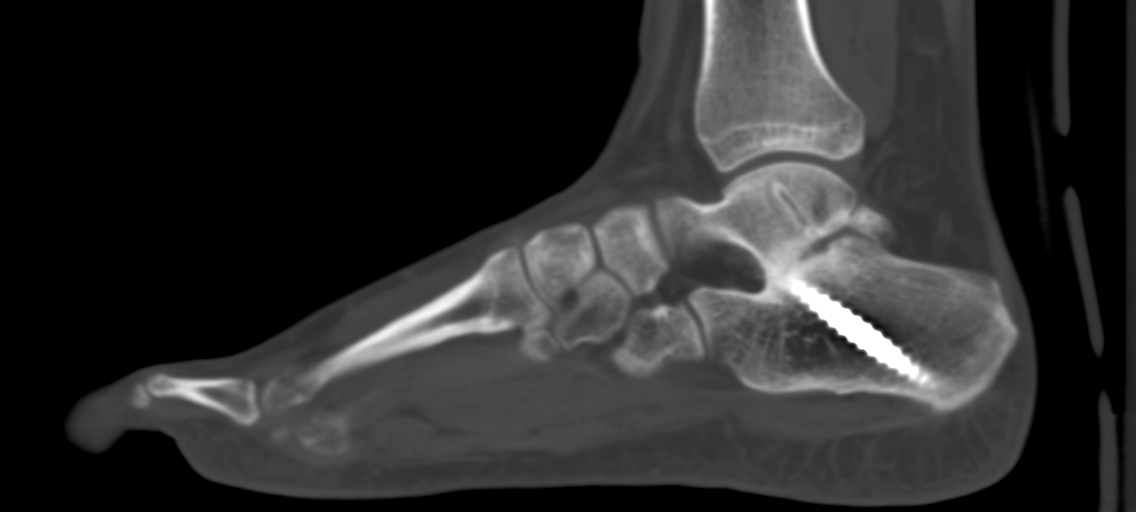
[im 26/44  bone]
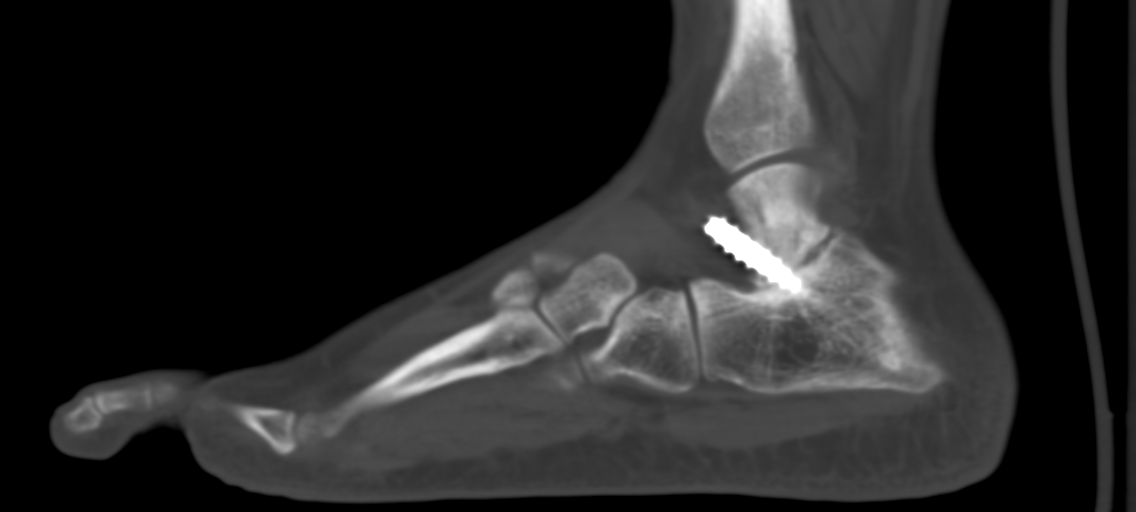
[im 29/44  bone]
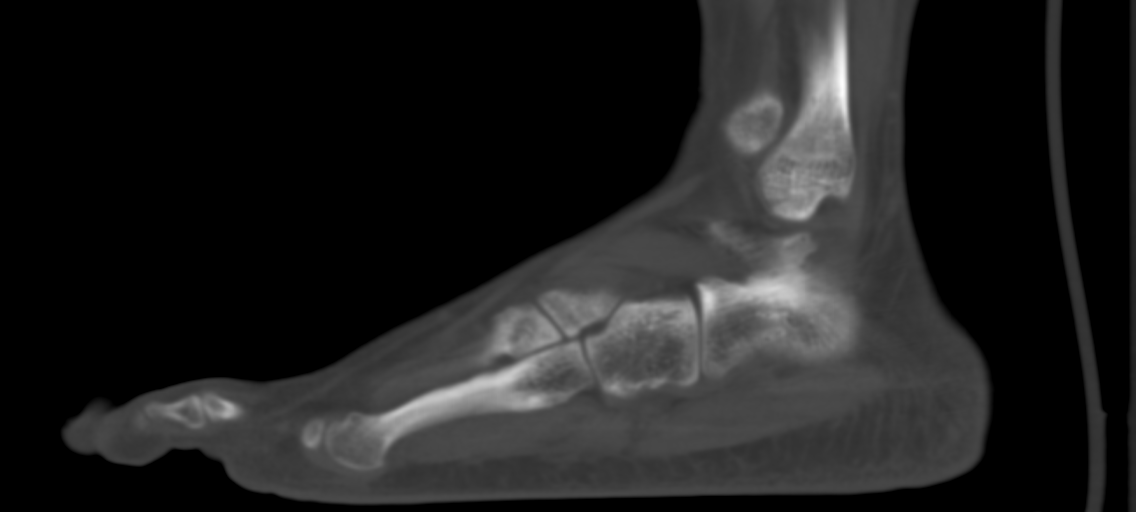

[Series 602: long axis bone · axial · 0.54mm/px · z∈[+24,+24]mm · 1 of 76 slices shown, 2 images]
[im 38/76  soft-tissue]
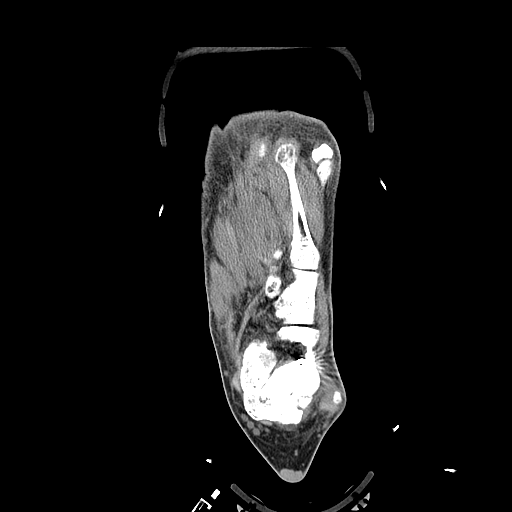
[im 38/76  bone]
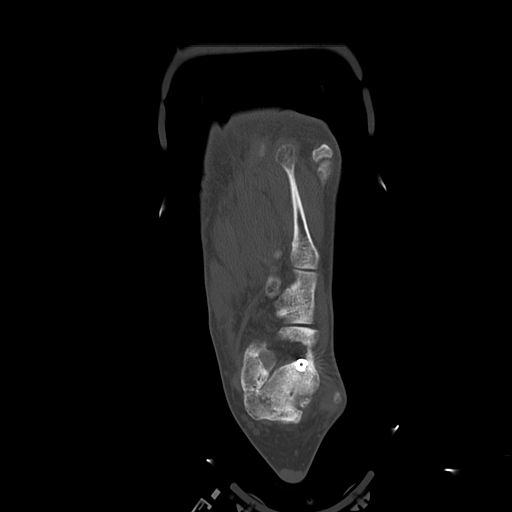

[9 of 33 positions shown; findings below may reference images not displayed]

FINDINGS: Bones/Joint/Cartilage

A single large screw is in place across the posterior facet of the
subtalar joint. The distal 1.7 cm of the screw extend out of the
talus. The inferior 60% of the posterior facet appear fused. The
middle facet of the subtalar joint is solidly fused. It is not clear
whether this is postoperative or congenital. Tract from a prior
screw is seen across the posterior facet of the subtalar joint. The
patient has a prominent Stieda's process of the calcaneus measuring
approximately 1.3 cm AP by 0.7 cm craniocaudal by 1.7 cm transverse.
No acute bony or joint abnormality is identified. No evidence of
arthropathy.

Ligaments

Suboptimally assessed by CT.

Muscles and Tendons

Intact and normal in appearance.

Soft tissues

Normal appearance.
IMPRESSION: Single large screw across the posterior facet of the subtalar joint.
The distal end of the screw extends 1.7 cm AP on bone.

Solidly fused middle facet of the subtalar joint could be congenital
or postoperative.
